# Patient Record
Sex: Male | Born: 1945 | Race: White | Hispanic: No | Marital: Married | Smoking: Former smoker
Health system: Southern US, Community
[De-identification: ages and names within clinical notes are randomized; demographics above are authoritative.]

## PROBLEM LIST (undated history)

## (undated) DIAGNOSIS — D469 Myelodysplastic syndrome, unspecified: Secondary | ICD-10-CM

## (undated) DIAGNOSIS — M1990 Unspecified osteoarthritis, unspecified site: Secondary | ICD-10-CM

## (undated) DIAGNOSIS — Q059 Spina bifida, unspecified: Secondary | ICD-10-CM

## (undated) DIAGNOSIS — S88919A Complete traumatic amputation of unspecified lower leg, level unspecified, initial encounter: Secondary | ICD-10-CM

## (undated) DIAGNOSIS — IMO0001 Reserved for inherently not codable concepts without codable children: Secondary | ICD-10-CM

## (undated) DIAGNOSIS — S2249XA Multiple fractures of ribs, unspecified side, initial encounter for closed fracture: Secondary | ICD-10-CM

---
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
## 1989-09-01 DIAGNOSIS — S88919A Complete traumatic amputation of unspecified lower leg, level unspecified, initial encounter: Secondary | ICD-10-CM

## 1989-09-01 HISTORY — DX: Complete traumatic amputation of unspecified lower leg, level unspecified, initial encounter: S88.919A

## 2004-09-12 ENCOUNTER — Ambulatory Visit: Payer: Self-pay | Admitting: Family Medicine

## 2005-01-23 ENCOUNTER — Ambulatory Visit: Payer: Self-pay | Admitting: Family Medicine

## 2005-04-14 ENCOUNTER — Ambulatory Visit: Payer: Self-pay | Admitting: Family Medicine

## 2012-05-02 DIAGNOSIS — S2249XA Multiple fractures of ribs, unspecified side, initial encounter for closed fracture: Secondary | ICD-10-CM

## 2012-05-02 HISTORY — DX: Multiple fractures of ribs, unspecified side, initial encounter for closed fracture: S22.49XA

## 2014-05-02 ENCOUNTER — Telehealth: Payer: Self-pay | Admitting: Oncology

## 2014-05-02 NOTE — Telephone Encounter (Signed)
S/W PATIENT AND GAVE NP APPT FOR 09/09 @ 1:30 W/DR. SHADAD.  [place]

## 2014-05-02 NOTE — Telephone Encounter (Signed)
C/D 05/02/14 for appt. 05/10/14

## 2014-05-04 ENCOUNTER — Other Ambulatory Visit: Payer: Self-pay

## 2014-05-04 ENCOUNTER — Ambulatory Visit: Payer: Self-pay

## 2014-05-04 ENCOUNTER — Ambulatory Visit: Payer: Self-pay | Admitting: Oncology

## 2014-05-05 ENCOUNTER — Other Ambulatory Visit: Payer: Self-pay | Admitting: Oncology

## 2014-05-05 DIAGNOSIS — D61818 Other pancytopenia: Secondary | ICD-10-CM

## 2014-05-08 ENCOUNTER — Other Ambulatory Visit: Payer: Self-pay | Admitting: Oncology

## 2014-05-08 DIAGNOSIS — D649 Anemia, unspecified: Secondary | ICD-10-CM

## 2014-05-09 ENCOUNTER — Telehealth: Payer: Self-pay | Admitting: Medical Oncology

## 2014-05-09 NOTE — Telephone Encounter (Signed)
Confirmation call regarding tomorrow's appts. Patient confirmed, denies questions at this time.

## 2014-05-10 ENCOUNTER — Telehealth: Payer: Self-pay | Admitting: Oncology

## 2014-05-10 ENCOUNTER — Encounter: Payer: Self-pay | Admitting: Oncology

## 2014-05-10 ENCOUNTER — Ambulatory Visit: Payer: Commercial Managed Care - HMO

## 2014-05-10 ENCOUNTER — Ambulatory Visit (HOSPITAL_BASED_OUTPATIENT_CLINIC_OR_DEPARTMENT_OTHER): Payer: Medicare HMO | Admitting: Oncology

## 2014-05-10 ENCOUNTER — Other Ambulatory Visit (HOSPITAL_BASED_OUTPATIENT_CLINIC_OR_DEPARTMENT_OTHER): Payer: Medicare HMO

## 2014-05-10 VITALS — BP 170/84 | HR 65 | Temp 98.1°F | Resp 18 | Ht 68.5 in | Wt 199.1 lb

## 2014-05-10 DIAGNOSIS — D729 Disorder of white blood cells, unspecified: Secondary | ICD-10-CM

## 2014-05-10 DIAGNOSIS — D649 Anemia, unspecified: Secondary | ICD-10-CM

## 2014-05-10 DIAGNOSIS — M069 Rheumatoid arthritis, unspecified: Secondary | ICD-10-CM | POA: Diagnosis not present

## 2014-05-10 DIAGNOSIS — D61818 Other pancytopenia: Secondary | ICD-10-CM

## 2014-05-10 LAB — MANUAL DIFFERENTIAL
ALC: 1.9 10*3/uL (ref 0.9–3.3)
ANC (CHCC MAN DIFF): 1.4 10*3/uL — AB (ref 1.5–6.5)
BLASTS: 2 % — AB (ref 0–0)
Band Neutrophils: 0 % (ref 0–10)
Basophil: 1 % (ref 0–2)
EOS: 0 % (ref 0–7)
LYMPH: 53 % — ABNORMAL HIGH (ref 14–49)
MONO: 4 % (ref 0–14)
MYELOCYTES: 1 % — AB (ref 0–0)
Metamyelocytes: 0 % (ref 0–0)
OTHER CELL: 0 % (ref 0–0)
PLT EST: DECREASED
PROMYELO: 0 % (ref 0–0)
SEG: 39 % (ref 38–77)
VARIANT LYMPH: 0 % (ref 0–0)
nRBC: 2 % — ABNORMAL HIGH (ref 0–0)

## 2014-05-10 LAB — CBC & DIFF AND RETIC
HCT: 27.1 % — ABNORMAL LOW (ref 38.4–49.9)
HEMOGLOBIN: 7.8 g/dL — AB (ref 13.0–17.1)
IMMATURE RETIC FRACT: 29.7 % — AB (ref 3.00–10.60)
MCH: 28 pg (ref 27.2–33.4)
MCHC: 28.8 g/dL — ABNORMAL LOW (ref 32.0–36.0)
MCV: 97.1 fL (ref 79.3–98.0)
Platelets: 92 10*3/uL — ABNORMAL LOW (ref 140–400)
RBC: 2.79 10*6/uL — ABNORMAL LOW (ref 4.20–5.82)
RDW: 21.2 % — AB (ref 11.0–14.6)
RETIC CT ABS: 241.34 10*3/uL — AB (ref 34.80–93.90)
Retic %: 8.65 % — ABNORMAL HIGH (ref 0.80–1.80)
WBC: 3.5 10*3/uL — AB (ref 4.0–10.3)
nRBC: 3 % — ABNORMAL HIGH (ref 0–0)

## 2014-05-10 LAB — COMPREHENSIVE METABOLIC PANEL (CC13)
ALBUMIN: 3.6 g/dL (ref 3.5–5.0)
ALK PHOS: 51 U/L (ref 40–150)
ALT: 10 U/L (ref 0–55)
AST: 23 U/L (ref 5–34)
Anion Gap: 7 mEq/L (ref 3–11)
BILIRUBIN TOTAL: 0.71 mg/dL (ref 0.20–1.20)
BUN: 14.8 mg/dL (ref 7.0–26.0)
CO2: 21 mEq/L — ABNORMAL LOW (ref 22–29)
CREATININE: 0.8 mg/dL (ref 0.7–1.3)
Calcium: 9.5 mg/dL (ref 8.4–10.4)
Chloride: 110 mEq/L — ABNORMAL HIGH (ref 98–109)
Glucose: 96 mg/dl (ref 70–140)
POTASSIUM: 4.2 meq/L (ref 3.5–5.1)
Sodium: 138 mEq/L (ref 136–145)
Total Protein: 7.6 g/dL (ref 6.4–8.3)

## 2014-05-10 LAB — CHCC SMEAR

## 2014-05-10 LAB — FERRITIN CHCC: Ferritin: 292 ng/ml (ref 22–316)

## 2014-05-10 LAB — IRON AND TIBC CHCC
%SAT: 32 % (ref 20–55)
Iron: 84 ug/dL (ref 42–163)
TIBC: 260 ug/dL (ref 202–409)
UIBC: 176 ug/dL (ref 117–376)

## 2014-05-10 LAB — LACTATE DEHYDROGENASE (CC13): LDH: 380 U/L — ABNORMAL HIGH (ref 125–245)

## 2014-05-10 NOTE — Consult Note (Signed)
Reason for Referral: Pancytopenia.   HPI: 68 year old gentleman currently of , [place] where he lives alone was 30 years. He is a rather healthy gentleman with history of rheumatoid arthritis dating back for the last 2 years. He presented with joint swelling and pain. He was treated by Dr. Kellie Simmering initially with prednisone subsequently had a methotrexate and for the last year or so he has been on Plaquenil, methotrexate and prednisone. He has had routine hematological monitoring including routine CBC is during that time. His counts have been within normal range since 2013 and as late as May of 2015 his hemoglobin was 12.7 and platelet count of 143. His platelet count is down to 2.9 from 12.53 months earlier. A repeat a CBC in June of 2015 showed his white cell count is down to 2.2 and a platelet count of 165 his hemoglobin was 9.5. His most recent CBC on 05/01/2014 was hemoglobin of 7.7 platelet count of 81 and also can 2.7. His differential was unremarkable with a high LDH of 341 and a haptoglobin 122. Normal chemistry including an potassium of 4.5 creatinine of 0.9 and normal liver function tests. His methotrexate have been discontinued about 6-8 weeks and Plaquenil have been stopped just recently last 2 days. He was referred to me for further evaluation regarding these findings.  Clinically, he is very few symptoms. He does report fatigue and easy bruisability. He has a large news on his left arm after reaching out and heard a pop in his shoulder. He does not report any hematochezia or melena. As noted for epistaxis. He does not report any headaches or blurry vision or syncope. Does not report any chest pain, shortness of breath but does report exertional dyspnea. He does not report any palpitation or orthopnea. His report any cough or wheezing. He does not report any nausea, vomiting, abdominal pain. As that report any frequency urgency or hematuria. Does not report any skin rashes or petechiae. He did  notice multiple bruises noted throughout upper extremities. He did not report any weight loss or appetite changes. Did not report a decline in his performance status. He continues to be active and performs activities of daily living. 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 Rest of his review of systems unremarkable   His past medical history is significant for rheumatoid arthritis, hypertension BPH. 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 He denied any coronary artery disease or diabetes.   Current Outpatient Prescriptions  Medication Sig Dispense Refill  . aspirin 81 MG tablet Take 81 mg by mouth daily.      . Calcium Carbonate-Vitamin D (CALCIUM-VITAMIN D) 500-200 MG-UNIT per tablet Take 1 tablet by mouth daily. Calcium + D 600 mg      . carvedilol (COREG) 25 MG tablet Take 25 mg by mouth 2 (two) times daily with a meal.      . folic acid (FOLVITE) 1 MG tablet Take 1 mg by mouth daily.      Marland Kitchen oxybutynin (DITROPAN) 5 MG tablet Take 5 mg by mouth 2 (two) times daily.      . predniSONE (DELTASONE) 5 MG tablet Take 5 mg by mouth daily with breakfast.       No current facility-administered medications for this visit.     Allergies  Allergen Reactions  . Ciprofloxacin Hives    Had twenty years ago with reaction.  :  No family history on file.:  History  Social History  . Marital Status: Married    Spouse Name: N/A    Number of Children: N/A  . Years of Education: N/A   Occupational History  . Not on file.   Social History Main Topics  . Smoking status: Not on file  . Smokeless tobacco: Not on file  . Alcohol Use: Not on file  . Drug Use: Not on file  . Sexual Activity: Not on file   Other Topics Concern  . Not on file   Social History Narrative  . No narrative on file  :  Pertinent items are noted in HPI.  Exam: Blood pressure 170/84, pulse 65, temperature 98.1 F (36.7 C), temperature source Oral, resp. rate  18, height 5' 8.5" (1.74 m), weight 199 lb 1.6 oz (90.311 kg). General appearance: alert and cooperative Head: Normocephalic, without obvious abnormality, atraumatic Throat: normal findings: lips normal without lesions Neck: no adenopathy Back: negative Resp: clear to auscultation bilaterally Chest wall: no tenderness Cardio: regular rate and rhythm, S1, S2 normal, no murmur, click, rub or gallop GI: soft, non-tender; bowel sounds normal; no masses,  no organomegaly Extremities: extremities normal, atraumatic, no cyanosis or edema Pulses: 2+ and symmetric Skin:  Large ecchymosis noted left arm. No petechiae or rashes. His skin is tanned.   Recent Labs  05/10/14 1347  WBC 3.5*  HGB 7.8*  HCT 27.1*  PLT 92*    Recent Labs  05/10/14 1348  NA 138  K 4.2  CO2 21*  GLUCOSE 96  BUN 14.8  CREATININE 0.8  CALCIUM 9.5    His reticulocyte count was elevated at 8.65  Blood smear review: I. personally reviewed his smear today. I did not see any evidence of fragmentation was schistocytes. Stomatocytes were noted with polychromasia. Immature white cells were noted with few blasts.  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 This is a rather acute onset dating back to at least June of 2015. In the last 2 years his counts have been perfectly normal. He does have a history of rheumatoid arthritis and has been on methotrexate and Plaquenil for over a year now.  The differential diagnosis was discussed with the patient and his wife today. Autoimmune etiology causing his anemia and thrombocytopenia as a possibility. I see evidence of polychromasia as well as elevated LDH with increased reticulocytes. His haptoglobin is normal however. I am rechecking that as well as repeating a Coombs' test. Nonimmune hemolytic anemia also a possibility with stomatocytes noted on his peripheral smear.  Other possibility would include drug related pancytopenia or possible hemolysis. The  most likely agents at this point would be Plaquenil or methotrexate. This had been stopped at this time and anticipates recovery if that is the case.  The finding of a immature white cells in his peripheral smear is worrying for acute leukemia which needs to be ruled out first. I discussed with him that he needs a bone marrow biopsy as soon as possible to evaluate these findings. Once this has been ruled out, other etiologies can be dealt with at that time. Risks and benefits of this procedure was discussed and he is agreeable to proceed. We'll set that up for him under CT guidance in the near future. He'll followup after the results are available as soon as possible.  If this is indeed acute leukemia will make arrangements for him for an evaluation at [facility].  All his questions were answered today.

## 2014-05-10 NOTE — Progress Notes (Signed)
Please see consult note.  

## 2014-05-10 NOTE — Telephone Encounter (Signed)
gv and printed appt sched and avs for pt for SEpt. °

## 2014-05-11 ENCOUNTER — Encounter (HOSPITAL_COMMUNITY): Payer: Self-pay | Admitting: Pharmacy Technician

## 2014-05-11 LAB — DIRECT ANTIGLOBULIN TEST (NOT AT ARMC)
DAT (COMPLEMENT): NEGATIVE
DAT IGG: NEGATIVE

## 2014-05-11 LAB — HAPTOGLOBIN: Haptoglobin: 29 mg/dL — ABNORMAL LOW (ref 45–215)

## 2014-05-12 ENCOUNTER — Telehealth: Payer: Self-pay | Admitting: Oncology

## 2014-05-12 NOTE — Telephone Encounter (Signed)
Pt cld lft msg to r/s and mail out updated sch.....mailed updated sch...KJ

## 2014-05-14 ENCOUNTER — Encounter: Payer: Self-pay | Admitting: Oncology

## 2014-05-15 ENCOUNTER — Ambulatory Visit (HOSPITAL_COMMUNITY): Admission: RE | Admit: 2014-05-15 | Payer: Commercial Managed Care - HMO | Source: Ambulatory Visit

## 2014-05-16 ENCOUNTER — Encounter: Payer: Self-pay | Admitting: *Deleted

## 2014-05-16 ENCOUNTER — Other Ambulatory Visit: Payer: Self-pay | Admitting: Radiology

## 2014-05-16 NOTE — Progress Notes (Signed)
Spoke with patient, instructed him to communicate directly with the schedulers or the nurse, not my chart. Patient verbalizes understanding.

## 2014-05-18 ENCOUNTER — Ambulatory Visit (HOSPITAL_COMMUNITY)
Admission: RE | Admit: 2014-05-18 | Discharge: 2014-05-18 | Disposition: A | Discharge status: Home / Self Care | Payer: Medicare HMO | Source: Ambulatory Visit | Attending: Oncology | Admitting: Oncology

## 2014-05-18 ENCOUNTER — Encounter (HOSPITAL_COMMUNITY): Payer: Self-pay

## 2014-05-18 DIAGNOSIS — D61818 Other pancytopenia: Secondary | ICD-10-CM | POA: Diagnosis present

## 2014-05-18 HISTORY — DX: Spina bifida, unspecified: Q05.9

## 2014-05-18 HISTORY — DX: Multiple fractures of ribs, unspecified side, initial encounter for closed fracture: S22.49XA

## 2014-05-18 HISTORY — DX: Complete traumatic amputation of unspecified lower leg, level unspecified, initial encounter: S88.919A

## 2014-05-18 HISTORY — DX: Unspecified osteoarthritis, unspecified site: M19.90

## 2014-05-18 LAB — CBC
HCT: 27.5 % — ABNORMAL LOW (ref 39.0–52.0)
Hemoglobin: 7.7 g/dL — ABNORMAL LOW (ref 13.0–17.0)
MCH: 27.7 pg (ref 26.0–34.0)
MCHC: 28 g/dL — ABNORMAL LOW (ref 30.0–36.0)
MCV: 98.9 fL (ref 78.0–100.0)
PLATELETS: 81 10*3/uL — AB (ref 150–400)
RBC: 2.78 MIL/uL — ABNORMAL LOW (ref 4.22–5.81)
RDW: 20.3 % — AB (ref 11.5–15.5)
WBC: 2.5 10*3/uL — AB (ref 4.0–10.5)

## 2014-05-18 LAB — PROTIME-INR
INR: 1.09 (ref 0.00–1.49)
PROTHROMBIN TIME: 14.1 s (ref 11.6–15.2)

## 2014-05-18 LAB — BONE MARROW EXAM

## 2014-05-18 MED ORDER — FENTANYL CITRATE 0.05 MG/ML IJ SOLN
INTRAMUSCULAR | Status: AC | PRN
  Administered 2014-05-18 (×2): 50 ug via INTRAVENOUS

## 2014-05-18 MED ORDER — SODIUM CHLORIDE 0.9 % IV SOLN
INTRAVENOUS | Status: DC
  Administered 2014-05-18: 20 mL/h via INTRAVENOUS

## 2014-05-18 MED ORDER — MIDAZOLAM HCL 2 MG/2ML IJ SOLN
INTRAMUSCULAR | Status: AC | PRN
  Administered 2014-05-18: 2 mg via INTRAVENOUS

## 2014-05-18 MED ORDER — MIDAZOLAM HCL 2 MG/2ML IJ SOLN
INTRAMUSCULAR | Status: AC
Start: 2014-05-18 — End: 2014-05-18
  Filled 2014-05-18: qty 4

## 2014-05-18 MED ORDER — FENTANYL CITRATE 0.05 MG/ML IJ SOLN
INTRAMUSCULAR | Status: AC
Start: 2014-05-18 — End: 2014-05-18
  Filled 2014-05-18: qty 4

## 2014-05-18 NOTE — H&P (Signed)
Chief Complaint: "I'm here for a bone marrow biopsy"   Referring Physician(s): Shadad,Firas N  History of Present Illness: Eduardo Singh is a 68 y.o. male with history of pancytopenia who presents today for CT guided bone marrow biopsy for further evaluation.  Past Medical History  Diagnosis Date  . Multiple rib fractures sept 2013    right  . Arthritis   . 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 Spina bifida     Past Surgical History  Procedure Laterality Date  . Spine surgery  1956  . Foot surgery Bilateral 1961    Allergies: Ciprofloxacin  Medications: Prior to Admission medications   Medication Sig Start Date End Date Taking? Authorizing Provider  Calcium Carbonate-Vitamin D (CALCIUM-VITAMIN D) 500-200 MG-UNIT per tablet Take 1 tablet by mouth daily. Calcium + D 600 mg   Yes Historical Provider, MD  carvedilol (COREG) 25 MG tablet Take 25 mg by mouth 2 (two) times daily with a meal.   Yes Historical Provider, MD  folic acid (FOLVITE) 1 MG tablet Take 1 mg by mouth daily.   Yes Historical Provider, MD  ibuprofen (ADVIL,MOTRIN) 200 MG tablet Take 600 mg by mouth once as needed for moderate pain.   Yes Historical Provider, MD  oxybutynin (DITROPAN) 5 MG tablet Take 5 mg by mouth 2 (two) times daily.   Yes Historical Provider, MD  predniSONE (DELTASONE) 5 MG tablet Take 5 mg by mouth daily with breakfast.   Yes Historical Provider, MD  aspirin 81 MG tablet Take 81 mg by mouth every morning.     Historical Provider, MD    Family History  Problem Relation Age of Onset  . Cerebral aneurysm Mother   . Other Father     cardiac problems, unknown dx    History   Social History  . Marital Status: Married    Spouse Name: N/A    Number of Children: N/A  . Years of Education: N/A   Social History Main Topics  . Smoking status: Former Smoker    Quit date: 05/19/1999  . Smokeless tobacco: None  . Alcohol Use: Yes     Comment: occasional  . Drug Use:  None  . Sexual Activity: None   Other Topics Concern  . None   Social History Narrative  . None         Review of Systems    Constitutional: Positive for fatigue. Negative for fever and chills.  HENT: Positive for nosebleeds.   Respiratory: Positive for shortness of breath. Negative for cough.   Cardiovascular: Negative for chest pain.  Gastrointestinal: Negative for nausea, vomiting and abdominal pain.  Genitourinary: Negative for hematuria.  Musculoskeletal: Negative for back pain.  Neurological: Negative for headaches.  Hematological: Bruises/bleeds easily.    Vital Signs: BP 138/76  Pulse 71  Temp(Src) 97.8 F (36.6 C) (Oral)  Resp 16  SpO2 100%  Physical Exam  Constitutional: He is oriented to person, place, and time. He appears well-developed and well-nourished.  Cardiovascular: Normal rate and regular rhythm.   Pulmonary/Chest: Effort normal and breath sounds normal.  Abdominal: Soft. Bowel sounds are normal. There is no tenderness.  Musculoskeletal:  Rt BKA; trace LLE pretibial edema; left upper arm ecchymosis  Neurological: He is alert and oriented to person, place, and time.    Imaging: No results found.  Labs: Lab Results  Component Value Date   WBC 2.5* 05/18/2014   HCT 27.5* 05/18/2014   MCV 98.9  05/18/2014   PLT 81* 05/18/2014   NA 138 05/10/2014   K 4.2 05/10/2014   CO2 21* 05/10/2014   GLUCOSE 96 05/10/2014   BUN 14.8 05/10/2014   CREATININE 0.8 05/10/2014   CALCIUM 9.5 05/10/2014   PROT 7.6 05/10/2014   ALBUMIN 3.6 05/10/2014   AST 23 05/10/2014   ALT 10 05/10/2014   ALKPHOS 51 05/10/2014   BILITOT 0.71 05/10/2014   INR 1.09 05/18/2014    Assessment and Plan: Eduardo Singh is a 68 y.o. male with history of pancytopenia who presents today for CT guided bone marrow biopsy for further evaluation. Details/risks of procedure d/w pt/wife with their understanding and consent.    Rowe Robert, PAC

## 2014-05-18 NOTE — Discharge Instructions (Signed)
Conscious Sedation, Adult, Care After °Refer to this sheet in the next few weeks. These instructions provide you with information on caring for yourself after your procedure. Your health care provider may also give you more specific instructions. Your treatment has been planned according to current medical practices, but problems sometimes occur. Call your health care provider if you have any problems or questions after your procedure. °WHAT TO EXPECT AFTER THE PROCEDURE  °After your procedure: °· You may feel sleepy, clumsy, and have poor balance for several hours. °· Vomiting may occur if you eat too soon after the procedure. °HOME CARE INSTRUCTIONS °· Do not participate in any activities where you could become injured for at least 24 hours. Do not: °· Drive. °· Swim. °· Ride a bicycle. °· Operate heavy machinery. °· Cook. °· Use power tools. °· Climb ladders. °· Work from a high place. °· Do not make important decisions or sign legal documents until you are improved. °· If you vomit, drink water, juice, or soup when you can drink without vomiting. Make sure you have little or no nausea before eating solid foods. °· Only take over-the-counter or prescription medicines for pain, discomfort, or fever as directed by your health care provider. °· Make sure you and your family fully understand everything about the medicines given to you, including what side effects may occur. °· You should not drink alcohol, take sleeping pills, or take medicines that cause drowsiness for at least 24 hours. °· If you smoke, do not smoke without supervision. °· If you are feeling better, you may resume normal activities 24 hours after you were sedated. °· Keep all appointments with your health care provider. °SEEK MEDICAL CARE IF: °· Your skin is pale or bluish in color. °· You continue to feel nauseous or vomit. °· Your pain is getting worse and is not helped by medicine. °· You have bleeding or swelling. °· You are still sleepy or  feeling clumsy after 24 hours. °SEEK IMMEDIATE MEDICAL CARE IF: °· You develop a rash. °· You have difficulty breathing. °· You develop any type of allergic problem. °· You have a fever. °MAKE SURE YOU: °· Understand these instructions. °· Will watch your condition. °· Will get help right away if you are not doing well or get worse. °Document Released: 06/08/2013 Document Reviewed: 06/08/2013 °ExitCare® Patient Information ©2015 [employer]. This information is not intended to replace advice given to you by your health care provider. Make sure you discuss any questions you have with your health care provider. ° °Bone Marrow Aspiration, Bone Marrow Biopsy °Care After °Read the instructions outlined below and refer to this sheet in the next few weeks. These discharge instructions provide you with general information on caring for yourself after you leave the hospital. Your caregiver may also give you specific instructions. While your treatment has been planned according to the most current medical practices available, unavoidable complications occasionally occur. If you have any problems or questions after discharge, call your caregiver. °FINDING OUT THE RESULTS OF YOUR TEST °Not all test results are available during your visit. If your test results are not back during the visit, make an appointment with your caregiver to find out the results. Do not assume everything is normal if you have not heard from your caregiver or the medical facility. It is important for you to follow up on all of your test results.  °HOME CARE INSTRUCTIONS  °You have had sedation and may be sleepy or dizzy. Your thinking   may not be as clear as usual. For the next 24 hours: °· Only take over-the-counter or prescription medicines for pain, discomfort, and or fever as directed by your caregiver. °· Do not drink alcohol. °· Do not smoke. °· Do not drive. °· Do not make important legal decisions. °· Do not operate heavy machinery. °· Do not  care for small children by yourself. °· Keep your dressing clean and dry. You may replace dressing with a bandage after 24 hours. °· You may take a bath or shower after 24 hours. °· Use an ice pack for 20 minutes every 2 hours while awake for pain as needed. °SEEK MEDICAL CARE IF:  °· There is redness, swelling, or increasing pain at the biopsy site. °· There is pus coming from the biopsy site. °· There is drainage from a biopsy site lasting longer than one day. °· An unexplained oral temperature above 102° F (38.9° C) develops. °SEEK IMMEDIATE MEDICAL CARE IF:  °· You develop a rash. °· You have difficulty breathing. °· You develop any reaction or side effects to medications given. °Document Released: 03/07/2005 Document Revised: 11/10/2011 Document Reviewed: 08/15/2008 °ExitCare® Patient Information ©2015 [employer]. This information is not intended to replace advice given to you by your health care provider. Make sure you discuss any questions you have with your health care provider. ° °

## 2014-05-18 NOTE — H&P (Signed)
Patient seen.  For CT guided bone marrow biopsy today. 

## 2014-05-18 NOTE — Procedures (Signed)
Procedure:  Right iliac bone marrow biopsy under CT Findings:  Aspirate and core biopsy samples obtained.  No complications.

## 2014-05-19 ENCOUNTER — Ambulatory Visit: Payer: Commercial Managed Care - HMO | Admitting: Oncology

## 2014-05-26 ENCOUNTER — Encounter: Payer: Self-pay | Admitting: *Deleted

## 2014-05-29 LAB — CHROMOSOME ANALYSIS, BONE MARROW

## 2014-05-30 ENCOUNTER — Encounter: Payer: Self-pay | Admitting: Oncology

## 2014-05-30 ENCOUNTER — Ambulatory Visit (HOSPITAL_BASED_OUTPATIENT_CLINIC_OR_DEPARTMENT_OTHER): Payer: Medicare HMO | Admitting: Oncology

## 2014-05-30 ENCOUNTER — Telehealth: Payer: Self-pay | Admitting: Oncology

## 2014-05-30 VITALS — BP 158/74 | HR 66 | Temp 98.0°F | Resp 18 | Ht 68.5 in | Wt 197.2 lb

## 2014-05-30 DIAGNOSIS — D61818 Other pancytopenia: Secondary | ICD-10-CM

## 2014-05-30 DIAGNOSIS — D649 Anemia, unspecified: Secondary | ICD-10-CM

## 2014-05-30 DIAGNOSIS — D4620 Refractory anemia with excess of blasts, unspecified: Secondary | ICD-10-CM | POA: Diagnosis not present

## 2014-05-30 DIAGNOSIS — D469 Myelodysplastic syndrome, unspecified: Secondary | ICD-10-CM

## 2014-05-30 NOTE — Telephone Encounter (Signed)
Pt confirmed labs/ov per 09/29 POF, gave pt AVS.... KJ °

## 2014-05-30 NOTE — Progress Notes (Signed)
Hematology and Oncology Follow Up Visit  Eduardo Singh [id number] 1945-12-16 68 y.o. 05/30/2014 1:29 PM Singh,Eduardo, MDDough, Eduardo [place], MD   Principle Diagnosis: 68 year old gentleman with pancytopenia and confirmed diagnosis of refractory anemia with excess blasts. This was diagnosed in September of 2015.   Prior Therapy: He is status post a bone marrow biopsy done on 05/18/2014 and confirmed the diagnosis.  Current therapy: Under evaluation for systemic therapy.  Interim History:  Eduardo Singh presents today for a follow up visit.  since his last visit, he underwent a bone marrow biopsy which he tolerated without any complications. He did report some soreness around the bone marrow site but otherwise no issues. He did not report any active bleeding including epistaxis, hematochezia or melena. He does have some fatigue but no shortness of breath or chest pain.  He does report fatigue and easy bruisability. He has a large news on his left arm which is healing. He does not report any headaches or blurry vision or syncope. Does not report any chest pain, shortness of breath but does report exertional dyspnea. He does not report any palpitation or orthopnea. His report any cough or wheezing. He does not report any nausea, vomiting, abdominal pain. As that report any frequency urgency or hematuria. Does not report any skin rashes or petechiae. He did notice multiple bruises noted throughout upper extremities. He did not report any weight loss or appetite changes. Did not report a decline in his performance status. He continues to be active and performs activities of daily living. 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  Current Outpatient Prescriptions  Medication Sig Dispense Refill  . aspirin 81 MG tablet Take 81 mg by mouth every morning.       . Calcium  Carbonate-Vitamin D (CALCIUM-VITAMIN D) 500-200 MG-UNIT per tablet Take 1 tablet by mouth daily. Calcium + D 600 mg      . carvedilol (COREG) 25 MG tablet Take 25 mg by mouth 2 (two) times daily with a meal.      . folic acid (FOLVITE) 1 MG tablet Take 1 mg by mouth daily.      Marland Kitchen ibuprofen (ADVIL,MOTRIN) 200 MG tablet Take 600 mg by mouth once as needed for moderate pain.      Marland Kitchen oxybutynin (DITROPAN) 5 MG tablet Take 5 mg by mouth 2 (two) times daily.      . predniSONE (DELTASONE) 5 MG tablet Take 5 mg by mouth daily with breakfast.       No current facility-administered medications for this visit.     Allergies:  Allergies  Allergen Reactions  . Ciprofloxacin Hives    Had twenty years ago with reaction.    Past Medical History, Surgical history, Social history, and Family History were reviewed and updated.  Physical Exam: Blood pressure 158/74, pulse 66, temperature 98 F (36.7 C), temperature source Oral, resp. rate 18, height 5' 8.5" (1.74 m), weight 197 lb 3.2 oz (89.449 kg). ECOG: 0 General appearance: alert and cooperative Head: Normocephalic, without obvious abnormality Neck: no adenopathy Lymph nodes: Cervical, supraclavicular, and axillary nodes normal. Heart:regular rate and rhythm, S1, S2 normal, no murmur, click, rub or gallop Lung:chest clear, no wheezing, rales, normal symmetric air entry Abdomin: soft, non-tender, without masses or organomegaly EXT:no erythema, induration, or nodules   Lab Results: Lab Results  Component Value Date   WBC 2.5*  05/18/2014   HGB 7.7* 05/18/2014   HCT 27.5* 05/18/2014   MCV 98.9 05/18/2014   PLT 81* 05/18/2014     Chemistry      Component Value Date/Time   NA 138 05/10/2014 1348   K 4.2 05/10/2014 1348   CO2 21* 05/10/2014 1348   BUN 14.8 05/10/2014 1348   CREATININE 0.8 05/10/2014 1348      Component Value Date/Time   CALCIUM 9.5 05/10/2014 1348   ALKPHOS 51 05/10/2014 1348   AST 23 05/10/2014 1348   ALT 10 05/10/2014 1348   BILITOT 0.71  05/10/2014 1348      Impression and Plan:   68 year old however the following issues:  1. Refractory anemia with excess blasts. This was confirmed by a bone marrow biopsy done on 05/18/2014. 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 The natural course of this disease was discussed with the patient and I feel he is at high-risk of transforming into acute leukemia if he has not already done so. I feel he might be a reasonable candidate for induction treatment for AML it's indicated. For that reason, I will refer him to [facility] for an evaluation.  2. Symptomatic anemia: His symptoms are relatively mild and does not require transfusion at this time. I have educated him about signs and symptoms of bleeding as well. I will repeat his blood counts in 4 weeks and assess his need for transfusion at that time if needed to.   [facility], MD 9/29/20151:29 PM

## 2014-05-31 ENCOUNTER — Encounter: Payer: Self-pay | Admitting: Oncology

## 2014-05-31 NOTE — Progress Notes (Signed)
Faxed labs,demographics and office visit notes to renna @ [facility] [phone]

## 2014-05-31 NOTE — Progress Notes (Signed)
Per renna, new patient coordinator, Patient set up for an appt with dr Florene Glen at [facility] oct 30th @ 12:15. Patient aware and should be getting a packet in the mail for date, time, directions and parking. Request sent to pathology at [facility] long to fed-x path slides. Spoke with martha in radiology at [facility] long to have cd burned with all scans and images and fed-x to [facility] access center,attn: renna tulbert, [place] [address] floor),1st floor:[address], [place] [phone]

## 2014-05-31 NOTE — Telephone Encounter (Signed)
Called collaborative nurse Dixie RN to notify here of  This request.  Per Dixie RN this a ppointment is scheduled, it is not on a Wednesday and [facility] will send patient a welcome packet.

## 2014-06-05 ENCOUNTER — Ambulatory Visit: Payer: Commercial Managed Care - HMO | Admitting: Oncology

## 2014-06-08 ENCOUNTER — Encounter: Payer: Self-pay | Admitting: Oncology

## 2014-06-19 ENCOUNTER — Encounter (HOSPITAL_COMMUNITY): Payer: Self-pay | Admitting: Emergency Medicine

## 2014-06-19 ENCOUNTER — Emergency Department (HOSPITAL_COMMUNITY)
Admission: EM | Admit: 2014-06-19 | Discharge: 2014-06-19 | Disposition: A | Discharge status: Home / Self Care | Payer: Medicare HMO | Attending: Emergency Medicine | Admitting: Emergency Medicine

## 2014-06-19 DIAGNOSIS — M1990 Unspecified osteoarthritis, unspecified site: Secondary | ICD-10-CM | POA: Diagnosis not present

## 2014-06-19 DIAGNOSIS — Z7952 Long term (current) use of systemic steroids: Secondary | ICD-10-CM | POA: Insufficient documentation

## 2014-06-19 DIAGNOSIS — Z7982 Long term (current) use of aspirin: Secondary | ICD-10-CM | POA: Diagnosis not present

## 2014-06-19 DIAGNOSIS — Z79899 Other long term (current) drug therapy: Secondary | ICD-10-CM | POA: Insufficient documentation

## 2014-06-19 DIAGNOSIS — Z87891 Personal history of nicotine dependence: Secondary | ICD-10-CM | POA: Insufficient documentation

## 2014-06-19 DIAGNOSIS — Z8781 Personal history of (healed) traumatic fracture: Secondary | ICD-10-CM | POA: Diagnosis not present

## 2014-06-19 DIAGNOSIS — Z8776 Personal history of (corrected) congenital malformations of integument, limbs and musculoskeletal system: Secondary | ICD-10-CM | POA: Diagnosis not present

## 2014-06-19 DIAGNOSIS — R040 Epistaxis: Secondary | ICD-10-CM | POA: Diagnosis not present

## 2014-06-19 HISTORY — DX: Reserved for inherently not codable concepts without codable children: IMO0001

## 2014-06-19 HISTORY — DX: Myelodysplastic syndrome, unspecified: D46.9

## 2014-06-19 LAB — CBC WITH DIFFERENTIAL/PLATELET
Basophils Absolute: 0 10*3/uL (ref 0.0–0.1)
Basophils Relative: 0 % (ref 0–1)
Eosinophils Absolute: 0 10*3/uL (ref 0.0–0.7)
Eosinophils Relative: 0 % (ref 0–5)
HEMATOCRIT: 29.5 % — AB (ref 39.0–52.0)
Hemoglobin: 8.4 g/dL — ABNORMAL LOW (ref 13.0–17.0)
LYMPHS ABS: 2 10*3/uL (ref 0.7–4.0)
Lymphocytes Relative: 52 % — ABNORMAL HIGH (ref 12–46)
MCH: 27.5 pg (ref 26.0–34.0)
MCHC: 28.5 g/dL — AB (ref 30.0–36.0)
MCV: 96.4 fL (ref 78.0–100.0)
MONO ABS: 0.2 10*3/uL (ref 0.1–1.0)
Monocytes Relative: 6 % (ref 3–12)
NRBC: 2 /100{WBCs} — AB
Neutro Abs: 1.6 10*3/uL — ABNORMAL LOW (ref 1.7–7.7)
Neutrophils Relative %: 42 % — ABNORMAL LOW (ref 43–77)
Platelets: 134 10*3/uL — ABNORMAL LOW (ref 150–400)
RBC: 3.06 MIL/uL — ABNORMAL LOW (ref 4.22–5.81)
RDW: 19.6 % — AB (ref 11.5–15.5)
WBC: 3.8 10*3/uL — ABNORMAL LOW (ref 4.0–10.5)

## 2014-06-19 MED ORDER — OXYMETAZOLINE HCL 0.05 % NA SOLN
1.0000 | Freq: Once | NASAL | Status: AC
  Administered 2014-06-19: 1 via NASAL
  Filled 2014-06-19: qty 15

## 2014-06-19 NOTE — Discharge Instructions (Signed)

## 2014-06-19 NOTE — ED Notes (Addendum)
Pt reports intermittent nosebleed since yesterday at 5pm. Pt on aspirin, no blood thinner use. Pt has tissue in nose, replaced 10 minutes. Bleeding controled at present. Recently diagnosed myelodysplasia, blood count has been low. Has not started treatments yet.

## 2014-06-19 NOTE — ED Provider Notes (Signed)
CSN: [id number]     Arrival date & time 06/19/14  0747 History   First MD Initiated Contact with Patient 06/19/14 0813     Chief Complaint  Patient presents with  . Epistaxis     (Consider location/radiation/quality/duration/timing/severity/associated sxs/prior Treatment) HPI  This is a 68 year old male with a recent diagnosis of myelodysplastic syndrome who presents with a nosebleed. Patient reports onset last night after dinner. He reports intermittent bleeding throughout the night from his left nare.  He is not on any anticoagulants. He does take a baby aspirin.  He reports that he can feel plugged 1 pounds back of his throat. Denies any history of nosebleeds. Chart reviews recent platelet count of 81. Denies any pain or any other symptoms at this time.  Past Medical History  Diagnosis Date  . Multiple rib fractures sept 2013    right  . Arthritis   . 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 Spina bifida   . Myelodysplasia    Past Surgical History  Procedure Laterality Date  . Spine surgery  1956  . Foot surgery Bilateral 1961   Family History  Problem Relation Age of Onset  . Cerebral aneurysm Mother   . Other Father     cardiac problems, unknown dx   History  Substance Use Topics  . Smoking status: Former Smoker    Quit date: 05/19/1999  . Smokeless tobacco: Not on file  . Alcohol Use: Yes     Comment: occasional    Review of Systems  HENT: Positive for nosebleeds.   Respiratory: Negative for chest tightness and shortness of breath.   Cardiovascular: Negative for chest pain.  Gastrointestinal: Negative for nausea and vomiting.  Neurological: Negative for dizziness and light-headedness.  All other systems reviewed and are negative.     Allergies  Ciprofloxacin  Home Medications   Prior to Admission medications   Medication Sig Start Date End Date Taking? Authorizing Provider  aspirin 81 MG tablet Take 81 mg by mouth every morning.    Yes  Historical Provider, MD  Calcium Carbonate-Vitamin D (CALCIUM-VITAMIN D) 500-200 MG-UNIT per tablet Take 1 tablet by mouth daily. Calcium + D 600 mg   Yes Historical Provider, MD  carvedilol (COREG) 25 MG tablet Take 25 mg by mouth 2 (two) times daily with a meal.   Yes Historical Provider, MD  folic acid (FOLVITE) 1 MG tablet Take 1 mg by mouth daily.   Yes Historical Provider, MD  ibuprofen (ADVIL,MOTRIN) 200 MG tablet Take 600 mg by mouth once as needed for moderate pain.   Yes Historical Provider, MD  oxybutynin (DITROPAN) 5 MG tablet Take 5 mg by mouth 2 (two) times daily.   Yes Historical Provider, MD  predniSONE (DELTASONE) 5 MG tablet Take 5 mg by mouth daily with breakfast.   Yes Historical Provider, MD   BP 108/66  Pulse 64  Temp(Src) 97.5 F (36.4 C) (Oral)  Resp 20  SpO2 96% Physical Exam  Nursing note and vitals reviewed. Constitutional: He is oriented to person, place, and time. He appears well-developed and well-nourished. No distress.  HENT:  Head: Normocephalic and atraumatic.  Mouth/Throat: Oropharynx is clear and moist.  After evacuation of clot, examination of the left nare reveals no active bleeding, small friable tissue in the anterior nasal septum  Eyes: Pupils are equal, round, and reactive to light.  Neck: Neck supple.  Cardiovascular: Normal rate, regular rhythm and normal heart sounds.   No murmur heard.  Pulmonary/Chest: Effort normal and breath sounds normal. No respiratory distress. He has no wheezes.  Musculoskeletal:  Right BKA  Lymphadenopathy:    He has no cervical adenopathy.  Neurological: He is alert and oriented to person, place, and time.  Skin: Skin is warm and dry.  Psychiatric: He has a normal mood and affect.    ED Course  EPISTAXIS MANAGEMENT Date/Time: 06/20/2014 6:40 AM Performed by: Thayer Jew, F Authorized by: Thayer Jew, F Consent: Verbal consent obtained. Risks and benefits: risks, benefits and alternatives were  discussed Consent given by: patient Treatment site: left anterior Repair method: silver nitrate Post-procedure assessment: bleeding stopped Treatment complexity: simple Recurrence: recurrence of recent bleed Patient tolerance: Patient tolerated the procedure well with no immediate complications.   (including critical care time) Labs Review Labs Reviewed  CBC WITH DIFFERENTIAL - Abnormal; Notable for the following:    WBC 3.8 (*)    RBC 3.06 (*)    Hemoglobin 8.4 (*)    HCT 29.5 (*)    MCHC 28.5 (*)    RDW 19.6 (*)    Platelets 134 (*)    Neutrophils Relative % 42 (*)    Lymphocytes Relative 52 (*)    nRBC 2 (*)    Neutro Abs 1.6 (*)    All other components within normal limits  CBC WITH DIFFERENTIAL    Imaging Review No results found.   EKG Interpretation None      MDM   Final diagnoses:  Epistaxis   Patient presents with epistaxis. He appears to be left anterior. Initially conservatively managed with aspirin. Nosebleed recurred. Silver nitrate applied with good hemostasis. Basic labwork reassuring with improved platelet count.  After history, exam, and medical workup I feel the patient has been appropriately medically screened and is safe for discharge home. Pertinent diagnoses were discussed with the patient. Patient was given return precautions.      Merryl Hacker, MD 06/20/14 [phone]

## 2014-06-30 ENCOUNTER — Other Ambulatory Visit: Payer: Commercial Managed Care - HMO

## 2014-06-30 ENCOUNTER — Ambulatory Visit: Payer: Commercial Managed Care - HMO | Admitting: Oncology

## 2014-07-08 ENCOUNTER — Encounter: Payer: Self-pay | Admitting: Oncology

## 2014-07-12 ENCOUNTER — Telehealth: Payer: Self-pay | Admitting: Oncology

## 2014-07-12 ENCOUNTER — Other Ambulatory Visit: Payer: Self-pay | Admitting: Oncology

## 2014-07-12 ENCOUNTER — Telehealth: Payer: Self-pay | Admitting: *Deleted

## 2014-07-12 DIAGNOSIS — D469 Myelodysplastic syndrome, unspecified: Secondary | ICD-10-CM

## 2014-07-12 NOTE — Telephone Encounter (Signed)
Sent through [employer].

## 2014-07-12 NOTE — Progress Notes (Signed)
I called the patient today to check on his clinical status and to get an updated regarding his recent visit to [facility]. He reports he received 2 units of packed red cell transfusions and felt better in the last few weeks. It was recommended that he will starts on treatment regarding his MDS but I have not received any correspondence or any medical records. He will call Dr. Abel Presto office to get his recommendations as soon as possible.  Mr. Eduardo Singh expressed interest in being treated at [facility] as is closer to his home. I will make referrals for him to be seen as soon as possible.

## 2014-07-12 NOTE — Telephone Encounter (Signed)
, °

## 2014-07-13 ENCOUNTER — Encounter: Payer: Self-pay | Admitting: Medical Oncology

## 2014-07-13 NOTE — Progress Notes (Signed)
Received fax from [facility] regarding pt's office visit there. Per Dr. Alen Blew, faxed these notes to [facility] Patient being referred there.  Call to [facility], spoke with Pamala Hurry, request for Med Rec to be faxed to her attention @ [phone], she knows to expect the rest of patient's med rec from our HIM department.  Message given to [place], for request of med rec to be faxed to [facility].

## 2014-07-14 ENCOUNTER — Telehealth: Payer: Self-pay | Admitting: Oncology

## 2014-07-14 NOTE — Telephone Encounter (Signed)
Faxed pt medical records to [facility] °

## 2014-07-20 ENCOUNTER — Telehealth: Payer: Self-pay | Admitting: Oncology

## 2014-07-20 NOTE — Telephone Encounter (Signed)
Pt appt with Dr Bobby Rumpf is 07/24/14@3 :64. Pt is aware

## 2014-09-18 ENCOUNTER — Other Ambulatory Visit (HOSPITAL_COMMUNITY)
Admission: RE | Admit: 2014-09-18 | Disposition: A | Discharge status: Home / Self Care | Payer: Commercial Managed Care - HMO | Source: Ambulatory Visit | Attending: Oncology | Admitting: Oncology

## 2014-09-18 LAB — BONE MARROW EXAM

## 2014-09-25 LAB — CHROMOSOME ANALYSIS, BONE MARROW

## 2014-10-11 ENCOUNTER — Encounter (HOSPITAL_COMMUNITY): Payer: Self-pay

## 2015-01-15 ENCOUNTER — Other Ambulatory Visit (HOSPITAL_COMMUNITY)
Admission: RE | Admit: 2015-01-15 | Disposition: A | Discharge status: Home / Self Care | Payer: Commercial Managed Care - HMO | Source: Ambulatory Visit | Attending: Oncology | Admitting: Oncology

## 2015-01-16 LAB — CBC
HCT: 33.3 % — ABNORMAL LOW (ref 39.0–52.0)
Hemoglobin: 8.8 g/dL — ABNORMAL LOW (ref 13.0–17.0)
MCH: 24.7 pg — ABNORMAL LOW (ref 26.0–34.0)
MCHC: 26.4 g/dL — ABNORMAL LOW (ref 30.0–36.0)
MCV: 93.5 fL (ref 78.0–100.0)
Platelets: 143 10*3/uL — ABNORMAL LOW (ref 150–400)
RBC: 3.56 MIL/uL — ABNORMAL LOW (ref 4.22–5.81)
RDW: 19.2 % — ABNORMAL HIGH (ref 11.5–15.5)
WBC: 2.4 10*3/uL — ABNORMAL LOW (ref 4.0–10.5)

## 2015-01-16 LAB — BONE MARROW EXAM

## 2015-01-25 LAB — CHROMOSOME ANALYSIS, BONE MARROW

## 2015-01-31 ENCOUNTER — Encounter (HOSPITAL_COMMUNITY): Payer: Self-pay

## 2015-05-08 ENCOUNTER — Other Ambulatory Visit (HOSPITAL_COMMUNITY)
Admission: RE | Admit: 2015-05-08 | Disposition: A | Discharge status: Home / Self Care | Payer: Commercial Managed Care - HMO | Source: Ambulatory Visit | Attending: Oncology | Admitting: Oncology

## 2015-05-08 LAB — BONE MARROW EXAM

## 2015-05-14 LAB — CHROMOSOME ANALYSIS, BONE MARROW

## 2015-06-18 DIAGNOSIS — D4622 Refractory anemia with excess of blasts 2: Secondary | ICD-10-CM | POA: Diagnosis not present

## 2015-06-18 DIAGNOSIS — D759 Disease of blood and blood-forming organs, unspecified: Secondary | ICD-10-CM | POA: Diagnosis not present

## 2015-07-23 DIAGNOSIS — D4622 Refractory anemia with excess of blasts 2: Secondary | ICD-10-CM | POA: Diagnosis not present

## 2015-08-28 ENCOUNTER — Other Ambulatory Visit (HOSPITAL_COMMUNITY)
Admission: RE | Admit: 2015-08-28 | Disposition: A | Discharge status: Home / Self Care | Payer: Commercial Managed Care - HMO | Source: Ambulatory Visit | Attending: Oncology | Admitting: Oncology

## 2015-08-28 DIAGNOSIS — D4621 Refractory anemia with excess of blasts 1: Secondary | ICD-10-CM | POA: Diagnosis not present

## 2015-08-28 LAB — BONE MARROW EXAM

## 2015-09-05 LAB — CHROMOSOME ANALYSIS, BONE MARROW

## 2015-09-11 ENCOUNTER — Encounter (HOSPITAL_COMMUNITY): Payer: Self-pay

## 2015-09-12 DIAGNOSIS — D4622 Refractory anemia with excess of blasts 2: Secondary | ICD-10-CM | POA: Diagnosis not present

## 2015-11-12 DIAGNOSIS — F329 Major depressive disorder, single episode, unspecified: Secondary | ICD-10-CM | POA: Diagnosis not present

## 2015-11-12 DIAGNOSIS — D4622 Refractory anemia with excess of blasts 2: Secondary | ICD-10-CM | POA: Diagnosis not present

## 2015-11-12 DIAGNOSIS — D696 Thrombocytopenia, unspecified: Secondary | ICD-10-CM | POA: Diagnosis not present

## 2015-12-31 DEATH — deceased

## 2016-07-19 IMAGING — CT CT BIOPSY
2 of 3 series · 4 of 10 positions shown, 7 images · non-contrast
Comparison: none

CLINICAL DATA: Pancytopenia.

[Series 4: add scan 5.0 b70f · axial · 0.74mm/px · z∈[-107,-102]mm · 2 of 4 slices shown, 5 images (1 of 2)]
[im 2/4  soft-tissue]
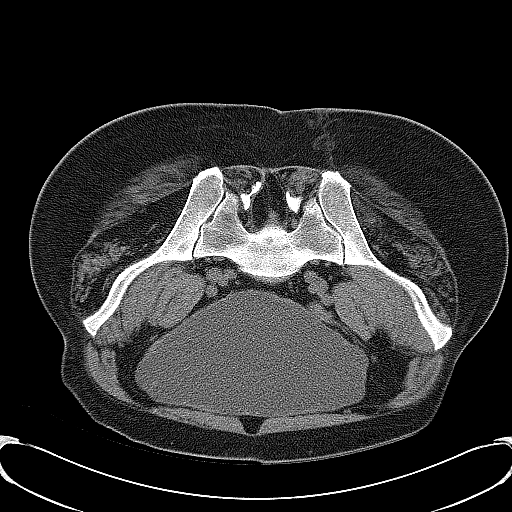
[im 2/4  lung]
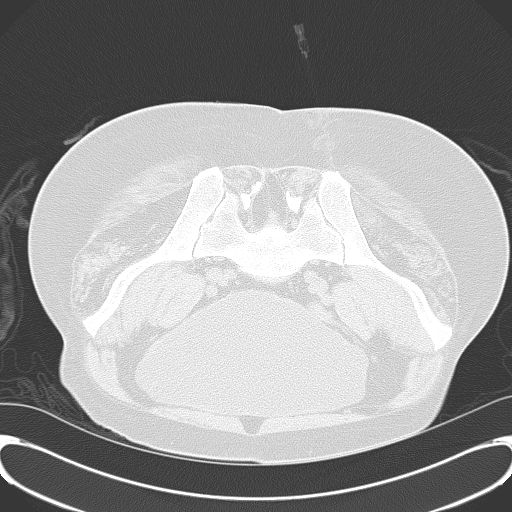
[im 2/4  bone]
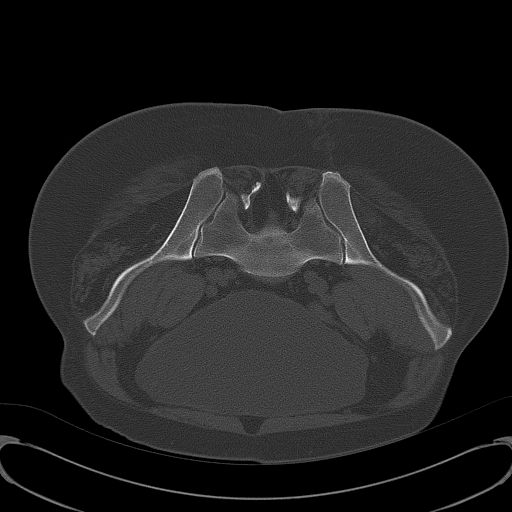
[im 3/4  soft-tissue]
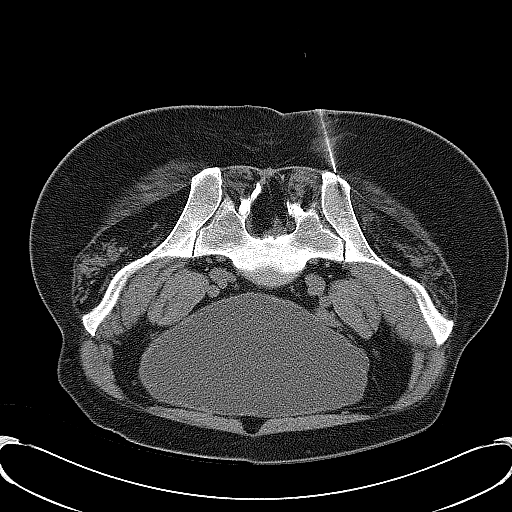
[im 3/4  lung]
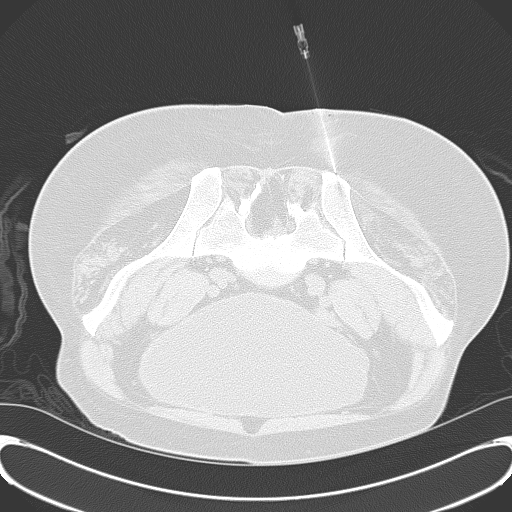

[Series 5: add scan 5.0 b70f · axial · 0.74mm/px · z∈[-107,-102]mm · 2 of 4 slices shown (2 of 2)]
[im 2/4  soft-tissue]
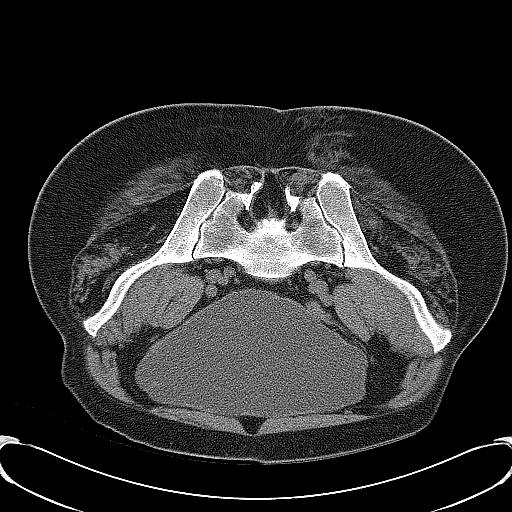
[im 3/4  soft-tissue]
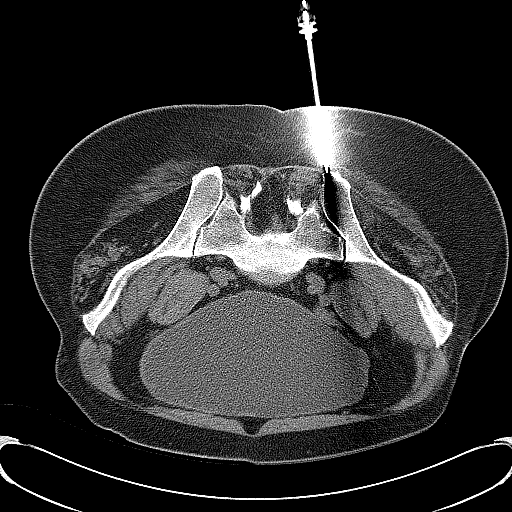

[4 of 10 positions shown; findings below may reference images not displayed]

EXAM:
CT GUIDED ASPIRATE AND CORE BIOPSY OF RIGHT ILIAC BONE MARROW

ANESTHESIA/SEDATION:
Versed 2.0 mg IV, Fentanyl 100 mcg IV

Total Moderate Sedation Time

15 minutes.

PROCEDURE:
The procedure risks, benefits, and alternatives were explained to
the patient. Questions regarding the procedure were encouraged and
answered. The patient understands and consents to the procedure.

The right gluteal region was prepped with Betadine. Sterile gown and
sterile gloves were used for the procedure. Local anesthesia was
provided with 1% Lidocaine.

Under CT guidance, an 11 gauge OnControl bone cutting needle was
advanced from a posterior approach into the right iliac bone. Needle
positioning was confirmed with CT. Initial non heparinized and
heparinized aspirate samples were obtained of bone marrow.

Core biopsy was performed through the needle.

COMPLICATIONS:
None
FINDINGS: Inspection of initial aspirate did reveal visible particles. Intact
core biopsy sample was obtained.
IMPRESSION: CT guided bone marrow biopsy of right posterior iliac bone with both
aspirate and core samples obtained.
